# Patient Record
Sex: Female | Born: 1965
Health system: Southern US, Community
[De-identification: ages and names within clinical notes are randomized; demographics above are authoritative.]

## PROBLEM LIST (undated history)

## (undated) DIAGNOSIS — M1990 Unspecified osteoarthritis, unspecified site: Secondary | ICD-10-CM

## (undated) DIAGNOSIS — H409 Unspecified glaucoma: Secondary | ICD-10-CM

## (undated) HISTORY — PX: TONSILLECTOMY: SUR1361

## (undated) HISTORY — DX: Unspecified osteoarthritis, unspecified site: M19.90

## (undated) HISTORY — DX: Unspecified glaucoma: H40.9

## (undated) HISTORY — PX: KNEE ARTHROSCOPY: SHX127

---

## 1998-06-22 ENCOUNTER — Other Ambulatory Visit: Admission: RE | Admit: 1998-06-22 | Discharge: 1998-06-22 | Payer: Self-pay | Admitting: *Deleted

## 1999-09-12 ENCOUNTER — Other Ambulatory Visit: Admission: RE | Admit: 1999-09-12 | Discharge: 1999-09-12 | Payer: Self-pay | Admitting: *Deleted

## 2000-10-08 ENCOUNTER — Other Ambulatory Visit: Admission: RE | Admit: 2000-10-08 | Discharge: 2000-10-08 | Payer: Self-pay | Admitting: *Deleted

## 2001-06-24 ENCOUNTER — Encounter: Payer: Self-pay | Admitting: Obstetrics & Gynecology

## 2001-06-24 ENCOUNTER — Inpatient Hospital Stay (HOSPITAL_COMMUNITY): Admission: AD | Admit: 2001-06-24 | Discharge: 2001-06-24 | Payer: Self-pay | Admitting: Obstetrics and Gynecology

## 2001-08-02 ENCOUNTER — Other Ambulatory Visit: Admission: RE | Admit: 2001-08-02 | Discharge: 2001-08-02 | Payer: Self-pay | Admitting: Obstetrics & Gynecology

## 2002-01-10 ENCOUNTER — Inpatient Hospital Stay (HOSPITAL_COMMUNITY): Admission: AD | Admit: 2002-01-10 | Discharge: 2002-01-13 | Payer: Self-pay | Admitting: Obstetrics and Gynecology

## 2002-02-09 ENCOUNTER — Other Ambulatory Visit: Admission: RE | Admit: 2002-02-09 | Discharge: 2002-02-09 | Payer: Self-pay | Admitting: Obstetrics and Gynecology

## 2006-05-19 ENCOUNTER — Emergency Department: Payer: Self-pay

## 2006-07-14 ENCOUNTER — Ambulatory Visit: Payer: Self-pay | Admitting: General Practice

## 2006-12-03 ENCOUNTER — Ambulatory Visit: Payer: Self-pay

## 2014-05-05 ENCOUNTER — Ambulatory Visit (INDEPENDENT_AMBULATORY_CARE_PROVIDER_SITE_OTHER): Payer: 59 | Admitting: Physician Assistant

## 2014-05-05 VITALS — BP 120/70 | HR 78 | Temp 98.7°F | Resp 16 | Ht 63.0 in | Wt 149.0 lb

## 2014-05-05 DIAGNOSIS — H409 Unspecified glaucoma: Secondary | ICD-10-CM | POA: Insufficient documentation

## 2014-05-05 DIAGNOSIS — M1990 Unspecified osteoarthritis, unspecified site: Secondary | ICD-10-CM | POA: Insufficient documentation

## 2014-05-05 DIAGNOSIS — H109 Unspecified conjunctivitis: Secondary | ICD-10-CM | POA: Diagnosis not present

## 2014-05-05 DIAGNOSIS — Z6826 Body mass index (BMI) 26.0-26.9, adult: Secondary | ICD-10-CM

## 2014-05-05 MED ORDER — AZELASTINE HCL 0.05 % OP SOLN: 1.0000 [drp] | Freq: Two times a day (BID) | OPHTHALMIC | Status: AC

## 2014-05-05 NOTE — Patient Instructions (Signed)
Allergic Conjunctivitis  The conjunctiva is a thin membrane that covers the visible white part of the eyeball and the underside of the eyelids. This membrane protects and lubricates the eye. The membrane has small blood vessels running through it that can normally be seen. When the conjunctiva becomes inflamed, the condition is called conjunctivitis. In response to the inflammation, the conjunctival blood vessels become swollen. The swelling results in redness in the normally white part of the eye.  The blood vessels of this membrane also react when a person has allergies and is then called allergic conjunctivitis. This condition usually lasts for as long as the allergy persists. Allergic conjunctivitis cannot be passed to another person (non-contagious). The likelihood of bacterial infection is great and the cause is not likely due to allergies if the inflamed eye has:  · A sticky discharge.  · Discharge or sticking together of the lids in the morning.  · Scaling or flaking of the eyelids where the eyelashes come out.  · Red swollen eyelids.  CAUSES   · Viruses.  · Irritants such as foreign bodies.  · Chemicals.  · General allergic reactions.  · Inflammation or serious diseases in the inside or the outside of the eye or the orbit (the boney cavity in which the eye sits) can cause a "red eye."  SYMPTOMS   · Eye redness.  · Tearing.  · Itchy eyes.  · Burning feeling in the eyes.  · Clear drainage from the eye.  · Allergic reaction due to pollens or ragweed sensitivity. Seasonal allergic conjunctivitis is frequent in the spring when pollens are in the air and in the fall.  DIAGNOSIS   This condition, in its many forms, is usually diagnosed based on the history and an ophthalmological exam. It usually involves both eyes. If your eyes react at the same time every year, allergies may be the cause. While most "red eyes" are due to allergy or an infection, the role of an eye (ophthalmological) exam is important. The exam  can rule out serious diseases of the eye or orbit.  TREATMENT   · Non-antibiotic eye drops, ointments, or medications by mouth may be prescribed if the ophthalmologist is sure the conjunctivitis is due to allergies alone.  · Over-the-counter drops and ointments for allergic symptoms should be used only after other causes of conjunctivitis have been ruled out, or as your caregiver suggests.  Medications by mouth are often prescribed if other allergy-related symptoms are present. If the ophthalmologist is sure that the conjunctivitis is due to allergies alone, treatment is normally limited to drops or ointments to reduce itching and burning.  HOME CARE INSTRUCTIONS   · Wash hands before and after applying drops or ointments, or touching the inflamed eye(s) or eyelids.  · Do not let the eye dropper tip or ointment tube touch the eyelid when putting medicine in your eye.  · Stop using your soft contact lenses and throw them away. Use a new pair of lenses when recovery is complete. You should run through sterilizing cycles at least three times before use after complete recovery if the old soft contact lenses are to be used. Hard contact lenses should be stopped. They need to be thoroughly sterilized before use after recovery.  · Itching and burning eyes due to allergies is often relieved by using a cool cloth applied to closed eye(s).  SEEK MEDICAL CARE IF:   · Your problems do not go away after two or three days of treatment.  ·   Your lids are sticky (especially in the morning when you wake up) or stick together.  · Discharge develops. Antibiotics may be needed either as drops, ointment, or by mouth.  · You have extreme light sensitivity.  · An oral temperature above 102° F (38.9° C) develops.  · Pain in or around the eye or any other visual symptom develops.  MAKE SURE YOU:   · Understand these instructions.  · Will watch your condition.  · Will get help right away if you are not doing well or get worse.  Document  Released: 04/12/2002 Document Revised: 04/14/2011 Document Reviewed: 03/08/2007  ExitCare® Patient Information ©2015 [employer]. This information is not intended to replace advice given to you by your health care provider. Make sure you discuss any questions you have with your health care provider.

## 2014-05-05 NOTE — Progress Notes (Signed)
Subjective:   Patient ID: Mary Reese, female     DOB: [birth year], 49 y.o.    MRN: [id number]  PCP: No primary care provider on file.  Chief Complaint  Patient presents with  . Eye Problem    left eye irritation and redness x 2 days     Prior to Admission medications   Medication Sig Start Date End Date Taking? Authorizing Provider  Travoprost, BAK Free, (TRAVATAN) 0.004 % SOLN ophthalmic solution Place 1 drop into the left eye at bedtime.   Yes Historical Provider, MD     Allergies  Allergen Reactions  . Amoxicillin     May have been reaction to infectious mono, not actually medication allergy     Patient Active Problem List   Diagnosis Date Noted  . BMI 26.0-26.9,adult 05/05/2014  . Arthritis   . Glaucoma      Family History  Problem Relation Age of Onset  . Diabetes Mother   . Heart disease Mother   . Diabetes Father   . Diabetes Brother      History   Social History  . Marital Status: Single    Spouse Name: n/a  . Number of Children: 2  . Years of Education: BS Degree   Occupational History  . Marine scientistenior Lab Technologist     [employer]   Social History Main Topics  . Smoking status: Never Smoker   . Smokeless tobacco: Never Used  . Alcohol Use: 1.2 oz/week    2 Standard drinks or equivalent per week  . Drug Use: No  . Sexual Activity:    Partners: Male   Other Topics Concern  . Not on file   Social History Narrative   Lives with her younger son. Her older son attends college at [employer].     HPI  Left eye irritation and redness x 2 days. At first thought it was allergies. But has not improved, and last night she had a lot of tearing. This morning there was yellow crusting, and the lids are swollen. No pain, feels "a little bit irritated" like there is "something in there." Since cleaning off the crusting this morning, the drainage is clear.  She wears glasses. Glaucoma.  Review of Systems Review of Systems  Constitutional: Negative  for fever and chills.  HENT: Positive for sneezing. Negative for congestion, postnasal drip, rhinorrhea and sinus pressure.   Eyes: Positive for discharge, redness and itching. Negative for photophobia, pain and visual disturbance.        Objective:  Physical Exam Physical Exam  Constitutional: She appears well-developed and well-nourished. No distress.  BP 120/70 mmHg  Pulse 78  Temp(Src) 98.7 F (37.1 C)  Resp 16  Ht 5\' 3"  (1.6 m)  Wt 149 lb (67.586 kg)  BMI 26.40 kg/m2  SpO2 100%  LMP 04/04/2014   HENT:  Head: Normocephalic and atraumatic.  Mouth/Throat: Uvula is midline, oropharynx is clear and moist and mucous membranes are normal.  Eyes: EOM are normal. Pupils are equal, round, and reactive to light. Right eye exhibits no chemosis, no discharge, no exudate and no hordeolum. No foreign body present in the right eye. Left eye exhibits no chemosis, no discharge, no exudate and no hordeolum. No foreign body present in the left eye. Right conjunctiva is not injected. Right conjunctiva has no hemorrhage. Left conjunctiva is injected (mildly). Left conjunctiva has no hemorrhage. No scleral icterus.  Fundoscopic exam:      The right eye shows exudate. The right eye  shows no hemorrhage and no papilledema.       The left eye shows no exudate, no hemorrhage and no papilledema.  Skin: Skin is warm, dry and intact.  Psychiatric: She has a normal mood and affect. Her speech is normal and behavior is normal.             Assessment & Plan:  1. Conjunctivitis of left eye Likely allergic. Not bacterial. Counseled on hygiene concerns to reduce risk of transmission should she develop bacterial conjunctivitis. OTC oral antihistamine if her systemic allergy symptoms worsen. If her symptoms worsen over the weekend, return here. Otherwise follow up with her eye specialist if symptoms persist. - azelastine (OPTIVAR) 0.05 % ophthalmic solution; Place 1 drop into both eyes 2 (two) times daily.   Dispense: 6 mL; Refill: 0   Fernande Bras, PA-C Physician Assistant-Certified [facility]

## 2016-02-05 DIAGNOSIS — H401123 Primary open-angle glaucoma, left eye, severe stage: Secondary | ICD-10-CM | POA: Diagnosis not present

## 2016-03-10 DIAGNOSIS — R87610 Atypical squamous cells of undetermined significance on cytologic smear of cervix (ASC-US): Secondary | ICD-10-CM | POA: Diagnosis not present

## 2016-03-10 DIAGNOSIS — Z01419 Encounter for gynecological examination (general) (routine) without abnormal findings: Secondary | ICD-10-CM | POA: Diagnosis not present

## 2016-05-21 DIAGNOSIS — H401123 Primary open-angle glaucoma, left eye, severe stage: Secondary | ICD-10-CM | POA: Diagnosis not present

## 2016-06-14 ENCOUNTER — Emergency Department (HOSPITAL_COMMUNITY): Payer: 59

## 2016-06-14 ENCOUNTER — Emergency Department (HOSPITAL_COMMUNITY)
Admission: EM | Admit: 2016-06-14 | Discharge: 2016-06-14 | Disposition: A | Discharge status: Home / Self Care | Payer: 59 | Attending: Emergency Medicine | Admitting: Emergency Medicine

## 2016-06-14 DIAGNOSIS — Z79899 Other long term (current) drug therapy: Secondary | ICD-10-CM | POA: Diagnosis not present

## 2016-06-14 DIAGNOSIS — R911 Solitary pulmonary nodule: Secondary | ICD-10-CM | POA: Diagnosis not present

## 2016-06-14 DIAGNOSIS — R079 Chest pain, unspecified: Secondary | ICD-10-CM | POA: Diagnosis not present

## 2016-06-14 DIAGNOSIS — R0789 Other chest pain: Secondary | ICD-10-CM | POA: Diagnosis not present

## 2016-06-14 LAB — CBC
HCT: 41 % (ref 36.0–46.0)
Hemoglobin: 13.1 g/dL (ref 12.0–15.0)
MCH: 28.1 pg (ref 26.0–34.0)
MCHC: 32 g/dL (ref 30.0–36.0)
MCV: 88 fL (ref 78.0–100.0)
PLATELETS: 199 10*3/uL (ref 150–400)
RBC: 4.66 MIL/uL (ref 3.87–5.11)
RDW: 13.1 % (ref 11.5–15.5)
WBC: 7 10*3/uL (ref 4.0–10.5)

## 2016-06-14 LAB — BASIC METABOLIC PANEL
Anion gap: 8 (ref 5–15)
BUN: 15 mg/dL (ref 6–20)
CALCIUM: 9.2 mg/dL (ref 8.9–10.3)
CO2: 26 mmol/L (ref 22–32)
CREATININE: 0.83 mg/dL (ref 0.44–1.00)
Chloride: 105 mmol/L (ref 101–111)
GFR calc Af Amer: >60 mL/min (ref >60)
Glucose, Bld: 109 mg/dL — ABNORMAL HIGH (ref 65–99)
Potassium: 3.9 mmol/L (ref 3.5–5.1)
SODIUM: 139 mmol/L (ref 135–145)

## 2016-06-14 LAB — I-STAT TROPONIN, ED: Troponin i, poc: 0 ng/mL (ref 0.00–0.08)

## 2016-06-14 NOTE — ED Provider Notes (Signed)
[facility]-EMERGENCY DEPT Provider Note   CSN: [id number] Arrival date & time: 06/14/16  0347     History   Chief Complaint Chief Complaint  Patient presents with  . Chest Pain    HPI Mary Reese is a 51 y.o. female.  HPI  51 y.o. female, presents to the Emergency Department today complaining of left sided chest pain without radiation. This occurred around 8pm last night before she ate dinner. States that the pain felt like a sharp sensation and was an intermittent pain. Not worse with movement. Denies radiation of pain. No N/V. No diaphoresis. No hx same. No hx ACS. States that the pain has since gone away since waiting in the ER. Pt notes that the pain did not occur with exertion. No fevers. No URI symptoms. No SOB/ABD pain. No hx smoking. No hx DVT/PE. No recent travel. No recent surgeries. No other symptoms noted.   Past Medical History:  Diagnosis Date  . Arthritis    knees  . Glaucoma    Dr. Marti Sleighhurman    Patient Active Problem List   Diagnosis Date Noted  . BMI 26.0-26.9,adult 05/05/2014  . Arthritis   . Glaucoma     Past Surgical History:  Procedure Laterality Date  . KNEE ARTHROSCOPY Left   . TONSILLECTOMY      OB History    No data available       Home Medications    Prior to Admission medications   Medication Sig Start Date End Date Taking? Authorizing Provider  azelastine (OPTIVAR) 0.05 % ophthalmic solution Place 1 drop into both eyes 2 (two) times daily. 05/05/14   Jeffery, Chelle, PA-C  Travoprost, BAK Free, (TRAVATAN) 0.004 % SOLN ophthalmic solution Place 1 drop into the left eye at bedtime.    [provider]    Family History Family History  Problem Relation Age of Onset  . Diabetes Mother   . Heart disease Mother   . Diabetes Father   . Diabetes Brother     Social History Social History  Substance Use Topics  . Smoking status: Never Smoker  . Smokeless tobacco: Never Used  . Alcohol use 1.2 oz/week    2 Standard drinks or  equivalent per week     Allergies   Amoxicillin   Review of Systems Review of Systems ROS reviewed and all are negative for acute change except as noted in the HPI.  Physical Exam Updated Vital Signs BP (!) 148/98 (BP Location: Left Arm)   Pulse 77   Temp 97.3 F (36.3 C) (Oral)   Resp 18   Ht 5\' 7"  (1.702 m)   Wt 73.9 kg   LMP 05/15/2016   SpO2 100%   BMI 25.53 kg/m   Physical Exam  Constitutional: She is oriented to person, place, and time. Vital signs are normal. She appears well-developed and well-nourished.  HENT:  Head: Normocephalic and atraumatic.  Right Ear: Hearing normal.  Left Ear: Hearing normal.  Eyes: Conjunctivae and EOM are normal. Pupils are equal, round, and reactive to light.  Neck: Normal range of motion. Neck supple.  Cardiovascular: Normal rate, regular rhythm, normal heart sounds, intact distal pulses and normal pulses.   Pulmonary/Chest: Effort normal and breath sounds normal. She has no decreased breath sounds. She has no wheezes. She has no rhonchi. She has no rales.    Abdominal: Soft. There is no tenderness.  Musculoskeletal: Normal range of motion.  Neurological: She is alert and oriented to person, place, and time.  Skin: Skin is warm and dry.  Psychiatric: She has a normal mood and affect. Her speech is normal and behavior is normal. Thought content normal.  Nursing note and vitals reviewed.  ED Treatments / Results  Labs (all labs ordered are listed, but only abnormal results are displayed) Labs Reviewed  BASIC METABOLIC PANEL - Abnormal; Notable for the following:       Result Value   Glucose, Bld 109 (*)    All other components within normal limits  CBC  I-STAT TROPOININ, ED  Rosezena Sensor, ED    EKG  EKG Interpretation None       Radiology Dg Chest 2 View  Result Date: 06/14/2016 CLINICAL DATA:  Left-sided chest pain without radiation. Nonsmoker. No relief with Zantac. EXAM: CHEST  2 VIEW COMPARISON:  None.  FINDINGS: 10 mm nodular opacity demonstrated over the right upper lung. This is adjacent to the first rib and may represent bone shadow but pulmonary nodule is not excluded. Suggest chest CT for further evaluation. Normal heart size and pulmonary vascularity. No focal airspace disease or consolidation in the lungs. No blunting of costophrenic angles. No pneumothorax. Mediastinal contours appear intact. Degenerative changes in the spine. IMPRESSION: 10 mm nodular opacity in the right upper lung possibly due to bone shadows involving the first rib but CT recommended to exclude a pulmonary nodule. No focal consolidation. Electronically Signed   By: Burman Nieves M.D.   On: 06/14/2016 04:19    Procedures Procedures (including critical care time)  Medications Ordered in ED Medications - No data to display   Initial Impression / Assessment and Plan / ED Course  I have reviewed the triage vital signs and the nursing notes.  Pertinent labs & imaging results that were available during my care of the patient were reviewed by me and considered in my medical decision making (see chart for details).  Final Clinical Impressions(s) / ED Diagnoses  {I have reviewed and evaluated the relevant laboratory values. {I have reviewed and evaluated the relevant imaging studies. {I have interpreted the relevant EKG. {I have reviewed the relevant previous healthcare records.  {I obtained HPI from historian. {Patient discussed with supervising physician.  ED Course:  Assessment: Pt is a 51 y.o. female presents with CP last night around 8pm. No radiation. Left sided. Sharp sensation. Constant pain. No N/V. No Diphoresis Risk Factors for ACS: FH. Asymptomatic since being in ED. Patient is to be discharged with recommendation to follow up with PCP in regards to today's hospital visit. Chest pain is not likely of cardiac or pulmonary etiology d/t presentation, perc negative, VSS, no tracheal deviation, no JVD or new murmur,  RRR, breath sounds equal bilaterally, EKG without acute abnormalities, negative troponin x2, and CXR showed Incidental Pulmonary Nodule 10mm on right. Pt states that she requested to withold CT and will follow up with PCP with findings. Heart Score 2. Pt has been advised start a PPI and return to the ED is CP becomes exertional, associated with diaphoresis or nausea, radiates to left jaw/arm, worsens or becomes concerning in any way. Pt appears reliable for follow up and is agreeable to discharge. Patient is in no acute distress. Vital Signs are stable. Patient is able to ambulate. Patient able to tolerate PO.   Disposition/Plan:  DC Home Additional Verbal discharge instructions given and discussed with patient.  Pt Instructed to f/u with PCP in the next week for evaluation and treatment of symptoms. Return precautions given Pt acknowledges and agrees with plan  Supervising Physician Gilda Crease, *  Final diagnoses:  Chest pain, unspecified type  Pulmonary nodule, right    New Prescriptions New Prescriptions   No medications on file     Audry Pili, Cordelia Poche 06/14/16 1610    Gilda Crease, MD 06/15/16 0230

## 2016-06-14 NOTE — ED Triage Notes (Signed)
Pt reports left sided cp w/o radiation. Pt reports taking Zantac w/o relief.

## 2016-06-14 NOTE — ED Notes (Signed)
Patient states chest pain has resolved now. She denies any pain or shortness of breath.

## 2016-06-14 NOTE — ED Notes (Signed)
Patient transported to X-ray 

## 2016-06-14 NOTE — Discharge Instructions (Signed)
Please read and follow all provided instructions.  Your diagnoses today include:  1. Chest pain, unspecified type   2. Pulmonary nodule, right     Tests performed today include: An EKG of your heart A chest x-ray Cardiac enzymes - a blood test for heart muscle damage Blood counts and electrolytes Vital signs. See below for your results today.   YOU HAVE A: 10 mm nodular opacity in the right upper lung possibly due to bone shadows involving the first rib but CT recommended to exclude a pulmonary nodule.  PLEASE FOLLOW UP WITH A PRIMARY CARE DOCTOR FOR AN OUTPATIENT CT SCAN OR REPEAT CHEST XRAY  Medications prescribed:   Take any prescribed medications only as directed.  Follow-up instructions: Please follow-up with your primary care provider as soon as you can for further evaluation of your symptoms.   Return instructions:  SEEK IMMEDIATE MEDICAL ATTENTION IF: You have severe chest pain, especially if the pain is crushing or pressure-like and spreads to the arms, back, neck, or jaw, or if you have sweating, nausea (feeling sick to your stomach), or shortness of breath. THIS IS AN EMERGENCY. Don't wait to see if the pain will go away. Get medical help at once. Call 911 or 0 (operator). DO NOT drive yourself to the hospital.  Your chest pain gets worse and does not go away with rest.  You have an attack of chest pain lasting longer than usual, despite rest and treatment with the medications your caregiver has prescribed.  You wake from sleep with chest pain or shortness of breath. You feel dizzy or faint. You have chest pain not typical of your usual pain for which you originally saw your caregiver.  You have any other emergent concerns regarding your health.  Additional Information: Chest pain comes from many different causes. Your caregiver has diagnosed you as having chest pain that is not specific for one problem, but does not require admission.  You are at low risk for an  acute heart condition or other serious illness.   Your vital signs today were: BP (!) 148/98 (BP Location: Left Arm)    Pulse 77    Temp 97.3 F (36.3 C) (Oral)    Resp 18    Ht 5\' 7"  (1.702 m)    Wt 73.9 kg    LMP 05/15/2016    SpO2 100%    BMI 25.53 kg/m  If your blood pressure (BP) was elevated above 135/85 this visit, please have this repeated by your doctor within one month. --------------

## 2016-06-17 LAB — I-STAT TROPONIN, ED: Troponin i, poc: 0 ng/mL (ref 0.00–0.08)

## 2016-08-20 DIAGNOSIS — H401123 Primary open-angle glaucoma, left eye, severe stage: Secondary | ICD-10-CM | POA: Diagnosis not present

## 2017-01-29 DIAGNOSIS — Z1231 Encounter for screening mammogram for malignant neoplasm of breast: Secondary | ICD-10-CM | POA: Diagnosis not present

## 2017-04-28 DIAGNOSIS — M170 Bilateral primary osteoarthritis of knee: Secondary | ICD-10-CM | POA: Diagnosis not present

## 2017-04-29 DIAGNOSIS — M179 Osteoarthritis of knee, unspecified: Secondary | ICD-10-CM | POA: Diagnosis not present

## 2017-04-29 DIAGNOSIS — Z0000 Encounter for general adult medical examination without abnormal findings: Secondary | ICD-10-CM | POA: Diagnosis not present

## 2017-06-16 DIAGNOSIS — K5730 Diverticulosis of large intestine without perforation or abscess without bleeding: Secondary | ICD-10-CM | POA: Diagnosis not present

## 2017-06-16 DIAGNOSIS — Z1211 Encounter for screening for malignant neoplasm of colon: Secondary | ICD-10-CM | POA: Diagnosis not present

## 2017-08-20 ENCOUNTER — Other Ambulatory Visit: Payer: Self-pay | Admitting: Family Medicine

## 2017-08-20 ENCOUNTER — Ambulatory Visit
Admission: RE | Admit: 2017-08-20 | Discharge: 2017-08-20 | Disposition: A | Discharge status: Home / Self Care | Payer: 59 | Source: Ambulatory Visit | Attending: Family Medicine | Admitting: Family Medicine

## 2017-08-20 DIAGNOSIS — M130 Polyarthritis, unspecified: Secondary | ICD-10-CM

## 2017-08-20 DIAGNOSIS — M1611 Unilateral primary osteoarthritis, right hip: Secondary | ICD-10-CM | POA: Diagnosis not present

## 2017-08-20 DIAGNOSIS — M1710 Unilateral primary osteoarthritis, unspecified knee: Secondary | ICD-10-CM | POA: Diagnosis not present

## 2017-08-20 DIAGNOSIS — M47816 Spondylosis without myelopathy or radiculopathy, lumbar region: Secondary | ICD-10-CM | POA: Diagnosis not present

## 2017-08-20 DIAGNOSIS — M533 Sacrococcygeal disorders, not elsewhere classified: Secondary | ICD-10-CM | POA: Diagnosis not present

## 2017-08-20 DIAGNOSIS — M1711 Unilateral primary osteoarthritis, right knee: Secondary | ICD-10-CM | POA: Diagnosis not present

## 2017-08-25 DIAGNOSIS — H401123 Primary open-angle glaucoma, left eye, severe stage: Secondary | ICD-10-CM | POA: Diagnosis not present

## 2017-09-10 DIAGNOSIS — M170 Bilateral primary osteoarthritis of knee: Secondary | ICD-10-CM | POA: Diagnosis not present

## 2017-09-10 DIAGNOSIS — M130 Polyarthritis, unspecified: Secondary | ICD-10-CM | POA: Diagnosis not present

## 2017-10-15 DIAGNOSIS — Z6828 Body mass index (BMI) 28.0-28.9, adult: Secondary | ICD-10-CM | POA: Diagnosis not present

## 2017-10-15 DIAGNOSIS — Z01419 Encounter for gynecological examination (general) (routine) without abnormal findings: Secondary | ICD-10-CM | POA: Diagnosis not present

## 2017-10-15 DIAGNOSIS — R87610 Atypical squamous cells of undetermined significance on cytologic smear of cervix (ASC-US): Secondary | ICD-10-CM | POA: Diagnosis not present

## 2018-02-24 DIAGNOSIS — Z1231 Encounter for screening mammogram for malignant neoplasm of breast: Secondary | ICD-10-CM | POA: Diagnosis not present

## 2018-06-18 DIAGNOSIS — E782 Mixed hyperlipidemia: Secondary | ICD-10-CM | POA: Diagnosis not present

## 2018-06-18 DIAGNOSIS — K219 Gastro-esophageal reflux disease without esophagitis: Secondary | ICD-10-CM | POA: Diagnosis not present

## 2018-07-02 DIAGNOSIS — E782 Mixed hyperlipidemia: Secondary | ICD-10-CM | POA: Diagnosis not present

## 2018-07-02 DIAGNOSIS — K219 Gastro-esophageal reflux disease without esophagitis: Secondary | ICD-10-CM | POA: Diagnosis not present

## 2019-02-24 IMAGING — CR DG LUMBAR SPINE COMPLETE 4+V
5 series · 5 of 5 positions shown · non-contrast
Comparison: No recent.

CLINICAL DATA: Back pain.  No injury.

EXAM:
LUMBAR SPINE - COMPLETE 4+ VIEW

[w lumbar spine ap]
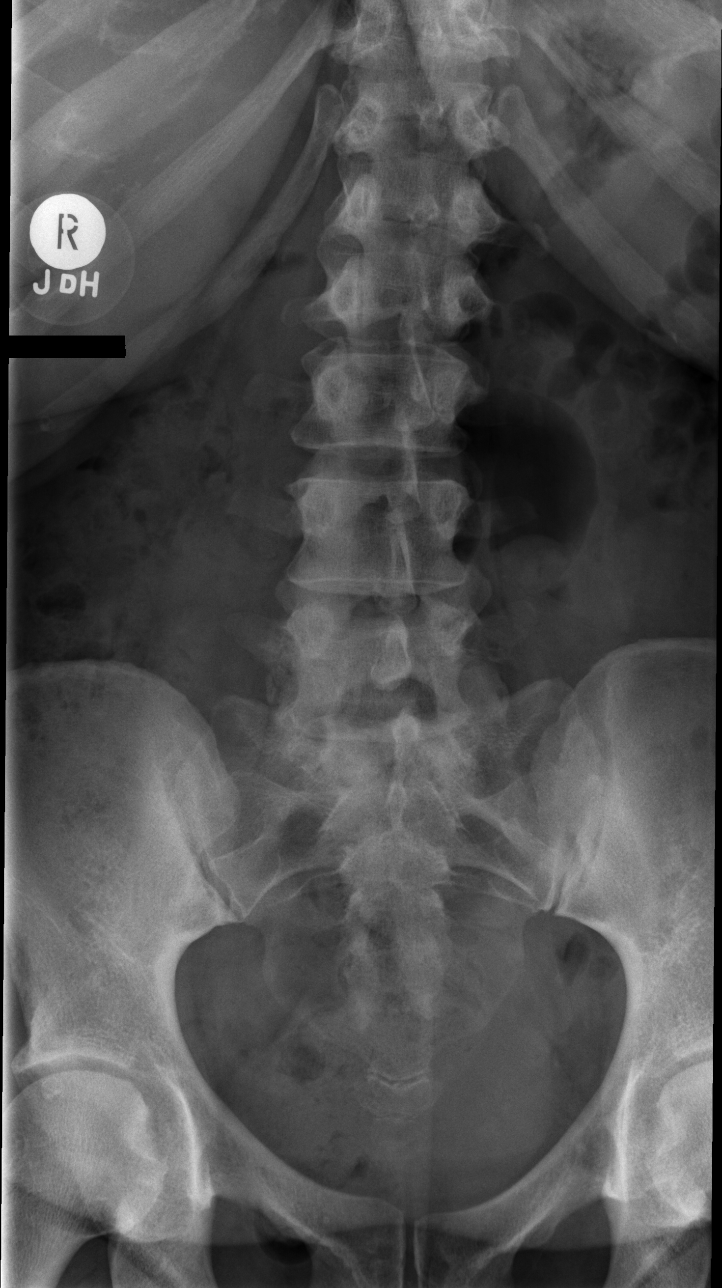

[w lumbar spine obl (1 of 2)]
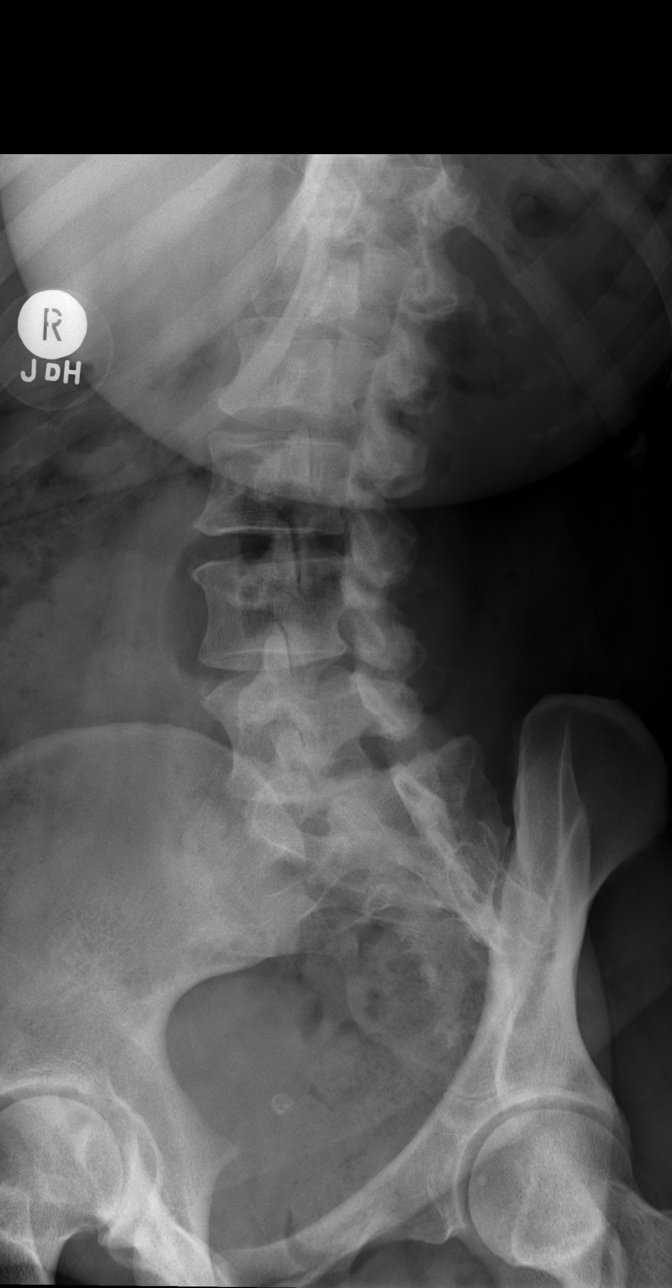

[w lumbar spine obl (2 of 2)]
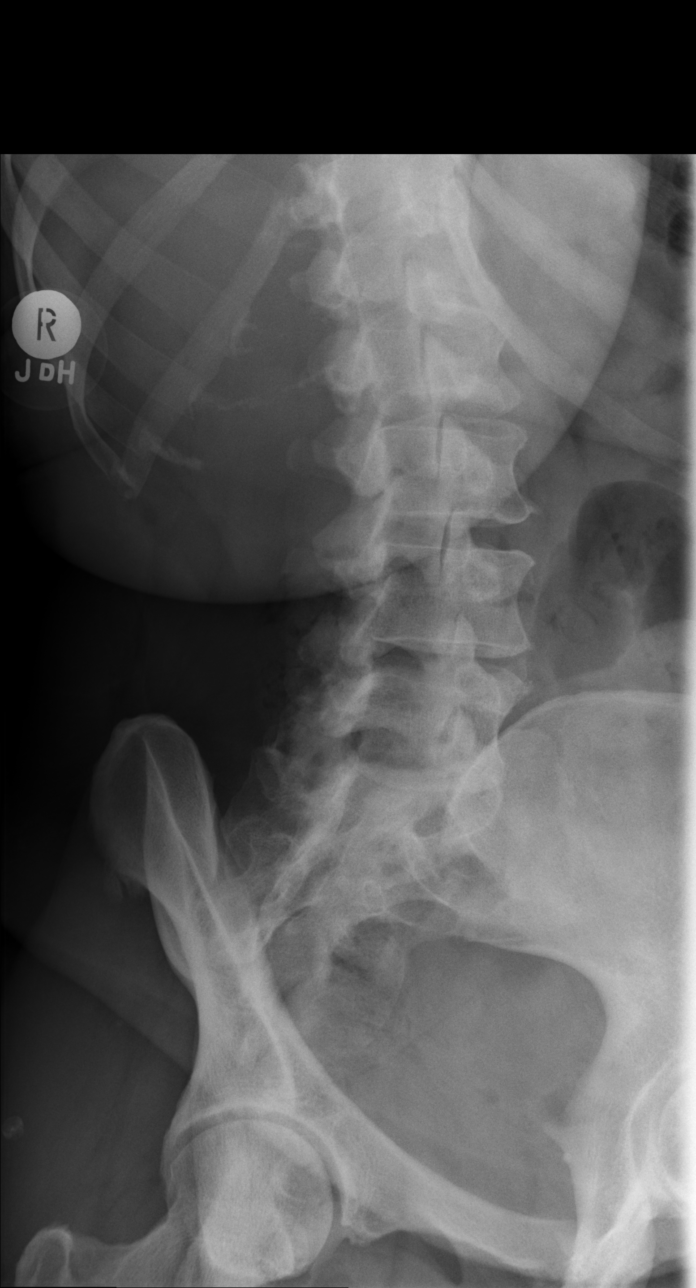

[w lumbar spine lat]
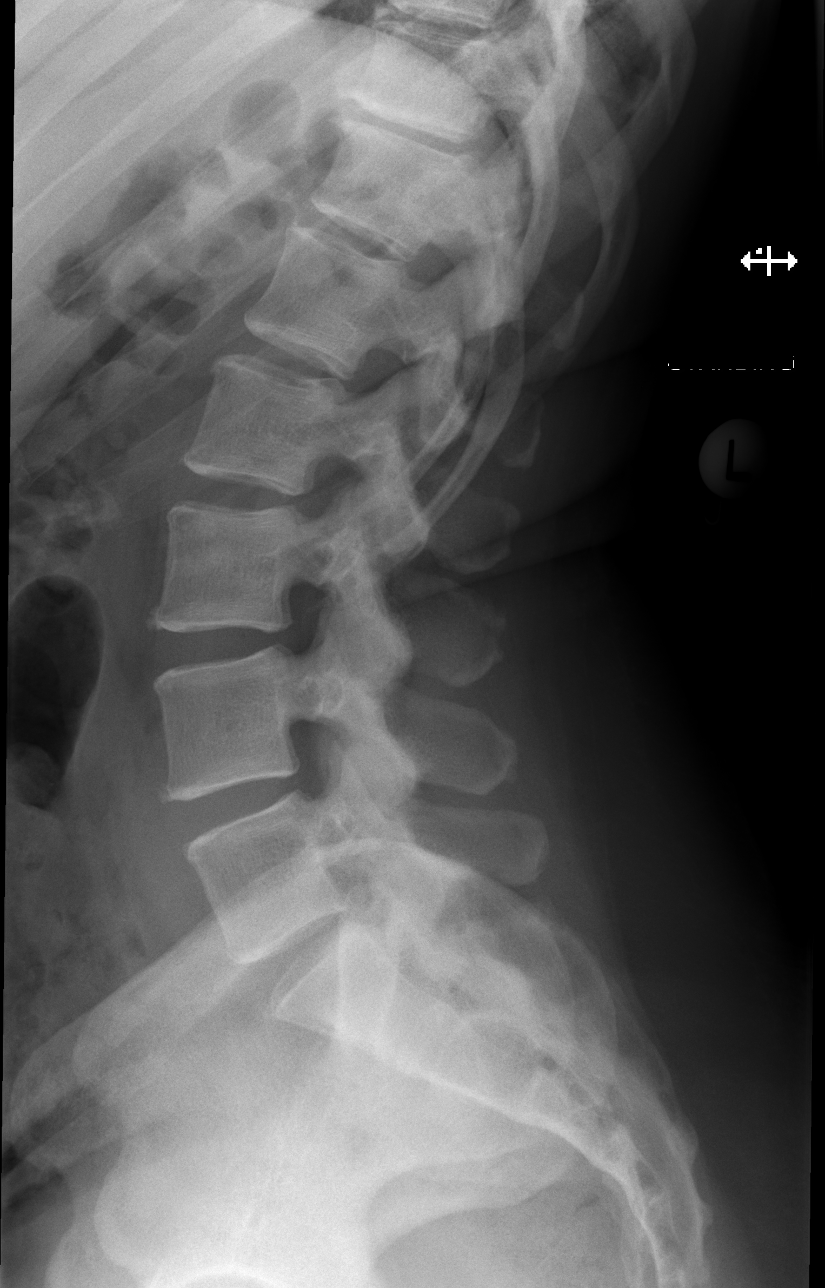

[w lumbar l-5 s-1 spot]
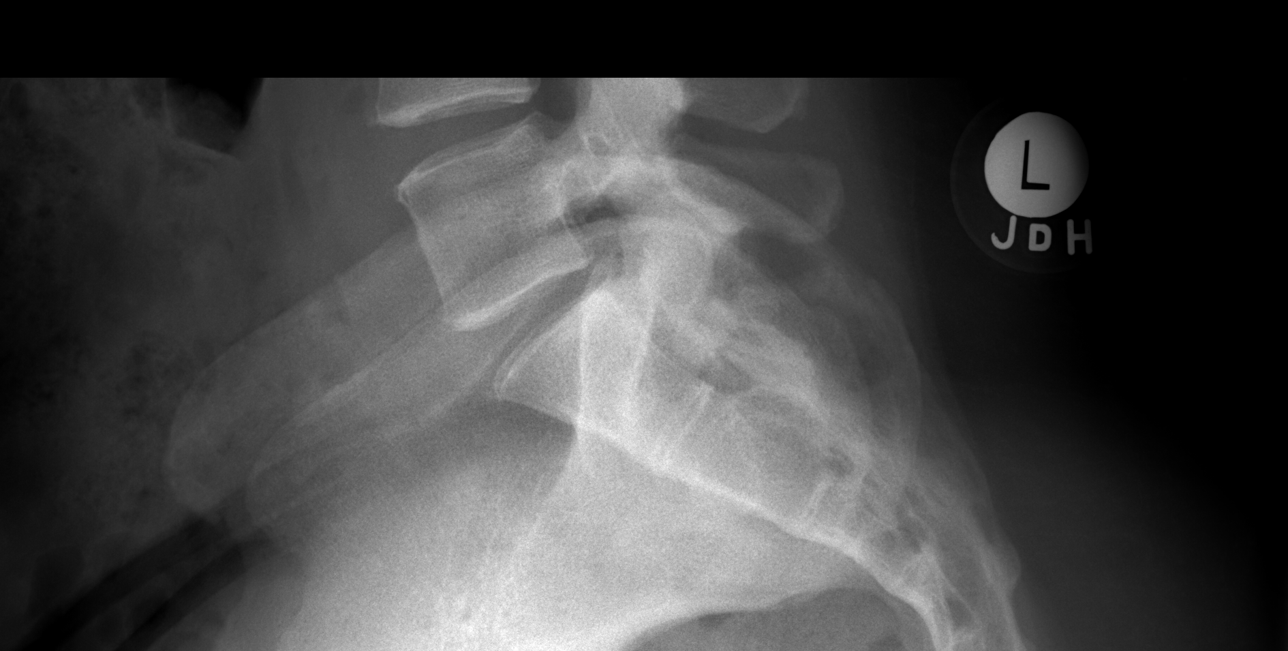

[5 of 5 positions shown; findings below may reference images not displayed]

FINDINGS: Paraspinal soft tissues normal. No acute bony abnormality
identified. No evidence of fracture. Normal bony alignment. Mild
diffuse degenerative change.
IMPRESSION: No acute abnormality.  Mild diffuse degenerative change.

## 2019-03-19 ENCOUNTER — Ambulatory Visit: Payer: 59 | Attending: Internal Medicine

## 2019-03-19 DIAGNOSIS — Z23 Encounter for immunization: Secondary | ICD-10-CM | POA: Insufficient documentation

## 2019-03-19 NOTE — Progress Notes (Signed)
   Covid-19 Vaccination Clinic  Name:  Mary Reese    MRN: [id number] DOB: February 07, 1965  03/19/2019  [name] was observed post Covid-19 immunization for 15 minutes without incidence. She was provided with Vaccine Information Sheet and instruction to access the V-Safe system.   [name] was instructed to call 911 with any severe reactions post vaccine: Marland Kitchen Difficulty breathing  . Swelling of your face and throat  . A fast heartbeat  . A bad rash all over your body  . Dizziness and weakness    Immunizations Administered    Name Date Dose VIS Date Route   Pfizer COVID-19 Vaccine 03/19/2019  8:55 AM 0.3 mL 01/14/2019 Intramuscular   Manufacturer: [employer], [employer]   Lot: [id number]   NDC: [address]-3

## 2019-04-11 ENCOUNTER — Ambulatory Visit: Payer: 59 | Attending: Internal Medicine

## 2019-04-11 DIAGNOSIS — Z23 Encounter for immunization: Secondary | ICD-10-CM | POA: Insufficient documentation

## 2019-04-11 NOTE — Progress Notes (Signed)
   Covid-19 Vaccination Clinic  Name:  ANVIKA GASHI    MRN: [id number] DOB: 1965-03-09  04/11/2019  Ms. Gayler was observed post Covid-19 immunization for 15 minutes without incident. She was provided with Vaccine Information Sheet and instruction to access the V-Safe system.   Ms. Lobb was instructed to call 911 with any severe reactions post vaccine: Marland Kitchen Difficulty breathing  . Swelling of face and throat  . A fast heartbeat  . A bad rash all over body  . Dizziness and weakness   Immunizations Administered    Name Date Dose VIS Date Route   Pfizer COVID-19 Vaccine 04/11/2019  9:22 AM 0.3 mL 01/14/2019 Intramuscular   Manufacturer: [employer], [employer]   Lot: [id number]   NDC: [address]-3
